# Patient Record
Sex: Male | Born: 1965 | Race: Black or African American | Hispanic: No | Marital: Married | Smoking: Never smoker
Health system: Southern US, Community
[De-identification: ages and names within clinical notes are randomized; demographics above are authoritative.]

## PROBLEM LIST (undated history)

## (undated) DIAGNOSIS — K219 Gastro-esophageal reflux disease without esophagitis: Secondary | ICD-10-CM

## (undated) DIAGNOSIS — E785 Hyperlipidemia, unspecified: Secondary | ICD-10-CM

## (undated) HISTORY — DX: Gastro-esophageal reflux disease without esophagitis: K21.9

## (undated) HISTORY — DX: Hyperlipidemia, unspecified: E78.5

---

## 2012-05-26 ENCOUNTER — Other Ambulatory Visit: Payer: Self-pay

## 2012-05-26 DIAGNOSIS — Z8742 Personal history of other diseases of the female genital tract: Secondary | ICD-10-CM

## 2012-06-09 LAB — CHROMOSOME ANALYSIS, PERIPHERAL BLOOD

## 2012-06-24 ENCOUNTER — Encounter: Payer: Self-pay | Admitting: Obstetrics & Gynecology

## 2013-01-09 DIAGNOSIS — E785 Hyperlipidemia, unspecified: Secondary | ICD-10-CM | POA: Insufficient documentation

## 2016-09-14 DIAGNOSIS — K219 Gastro-esophageal reflux disease without esophagitis: Secondary | ICD-10-CM | POA: Insufficient documentation

## 2017-09-03 ENCOUNTER — Ambulatory Visit: Payer: BC Managed Care – PPO | Admitting: Family Medicine

## 2017-09-03 ENCOUNTER — Encounter: Payer: Self-pay | Admitting: Family Medicine

## 2017-09-03 VITALS — BP 118/78 | HR 81 | Ht 68.5 in | Wt 149.5 lb

## 2017-09-03 DIAGNOSIS — R067 Sneezing: Secondary | ICD-10-CM

## 2017-09-03 DIAGNOSIS — Z7689 Persons encountering health services in other specified circumstances: Secondary | ICD-10-CM

## 2017-09-03 DIAGNOSIS — Z5320 Procedure and treatment not carried out because of patient's decision for unspecified reasons: Secondary | ICD-10-CM

## 2017-09-03 DIAGNOSIS — E785 Hyperlipidemia, unspecified: Secondary | ICD-10-CM

## 2017-09-03 LAB — COMPREHENSIVE METABOLIC PANEL
ALBUMIN: 4.4 g/dL (ref 3.5–5.2)
ALK PHOS: 52 U/L (ref 39–117)
ALT: 20 U/L (ref 0–53)
AST: 19 U/L (ref 0–37)
BILIRUBIN TOTAL: 0.5 mg/dL (ref 0.2–1.2)
BUN: 10 mg/dL (ref 6–23)
CO2: 33 mEq/L — ABNORMAL HIGH (ref 19–32)
Calcium: 10.1 mg/dL (ref 8.4–10.5)
Chloride: 101 mEq/L (ref 96–112)
Creatinine, Ser: 0.81 mg/dL (ref 0.40–1.50)
GFR: 129.1 mL/min (ref >60.00)
GLUCOSE: 70 mg/dL (ref 70–99)
POTASSIUM: 4 meq/L (ref 3.5–5.1)
SODIUM: 139 meq/L (ref 135–145)
TOTAL PROTEIN: 7.4 g/dL (ref 6.0–8.3)

## 2017-09-03 LAB — CBC WITH DIFFERENTIAL/PLATELET
Basophils Absolute: 0 10*3/uL (ref 0.0–0.1)
Basophils Relative: 0.5 % (ref 0.0–3.0)
EOS PCT: 6.3 % — AB (ref 0.0–5.0)
Eosinophils Absolute: 0.5 10*3/uL (ref 0.0–0.7)
HCT: 42.6 % (ref 39.0–52.0)
HEMOGLOBIN: 14.5 g/dL (ref 13.0–17.0)
LYMPHS ABS: 1.8 10*3/uL (ref 0.7–4.0)
Lymphocytes Relative: 21.9 % (ref 12.0–46.0)
MCHC: 34.2 g/dL (ref 30.0–36.0)
MCV: 93.7 fl (ref 78.0–100.0)
MONO ABS: 0.6 10*3/uL (ref 0.1–1.0)
MONOS PCT: 6.9 % (ref 3.0–12.0)
NEUTROS PCT: 64.4 % (ref 43.0–77.0)
Neutro Abs: 5.2 10*3/uL (ref 1.4–7.7)
Platelets: 157 10*3/uL (ref 150.0–400.0)
RBC: 4.55 Mil/uL (ref 4.22–5.81)
RDW: 13.1 % (ref 11.5–15.5)
WBC: 8 10*3/uL (ref 4.0–10.5)

## 2017-09-03 LAB — LIPID PANEL
CHOL/HDL RATIO: 5
Cholesterol: 192 mg/dL (ref 0–200)
HDL: 42.2 mg/dL (ref >39.00)
LDL Cholesterol: 125 mg/dL — ABNORMAL HIGH (ref 0–99)
NONHDL: 149.6
Triglycerides: 125 mg/dL (ref 0.0–149.0)
VLDL: 25 mg/dL (ref 0.0–40.0)

## 2017-09-03 NOTE — Patient Instructions (Addendum)
It was a pleasure to meet you today! I look forward to partnering with you for your health care needs  Please let me know if you would like me to put in a referral for colon cancer screening (information below)  Please follow up next year for complete physical exam  Colorectal Cancer Screening Colorectal cancer screening is a group of tests used to check for colorectal cancer. Colorectal refers to your colon and rectum. Your colon and rectum are located at the end of your large intestine and carry your bowel movements out of your body. Why is colorectal cancer screening done? It is common for abnormal growths (polyps) to form in the lining of your colon, especially as you get older. These polyps can be cancerous or become cancerous. If colorectal cancer is found at an early stage, it is treatable. Who should be screened for colorectal cancer? Screening is recommended for all adults at average risk starting at age 51. Tests may be recommended every 1 to 10 years. Your health care provider may recommend earlier or more frequent screening if you have:  A history of colorectal cancer or polyps.  A family member with a history of colorectal cancer or polyps.  Inflammatory bowel disease, such as ulcerative colitis or Crohn disease.  A type of hereditary colon cancer syndrome.  Colorectal cancer symptoms.  Types of screening tests There are several types of colorectal screening tests. They include:  Guaiac-based fecal occult blood testing.  Fecal immunochemical test (FIT).  Stool DNA test.  Barium enema.  Virtual colonoscopy.  Sigmoidoscopy. During this test, a sigmoidoscope is used to examine your rectum and lower colon. A sigmoidoscope is a flexible tube with a camera that is inserted through your anus into your rectum and lower colon.  Colonoscopy. During this test, a colonoscope is used to examine your entire colon. A colonoscope is a long, thin, flexible tube with a camera. This  test examines your entire colon and rectum.  This information is not intended to replace advice given to you by your health care provider. Make sure you discuss any questions you have with your health care provider. Document Released: 06/11/2009 Document Revised: 08/01/2015 Document Reviewed: 03/30/2013 Elsevier Interactive Patient Education  2018 [employer]   Allergic Rhinitis, Adult Allergic rhinitis is an allergic reaction that affects the mucous membrane inside the nose. It causes sneezing, a runny or stuffy nose, and the feeling of mucus going down the back of the throat (postnasal drip). Allergic rhinitis can be mild to severe. There are two types of allergic rhinitis:  Seasonal. This type is also called hay fever. It happens only during certain seasons.  Perennial. This type can happen at any time of the year.  What are the causes? This condition happens when the body's defense system (immune system) responds to certain harmless substances called allergens as though they were germs.  Seasonal allergic rhinitis is triggered by pollen, which can come from grasses, trees, and weeds. Perennial allergic rhinitis may be caused by:  House dust mites.  Pet dander.  Mold spores.  What are the signs or symptoms? Symptoms of this condition include:  Sneezing.  Runny or stuffy nose (nasal congestion).  Postnasal drip.  Itchy nose.  Tearing of the eyes.  Trouble sleeping.  Daytime sleepiness.  How is this diagnosed? This condition may be diagnosed based on:  Your medical history.  A physical exam.  Tests to check for related conditions, such as: ? Asthma. ? Pink eye. ? Ear  infection. ? Upper respiratory infection.  Tests to find out which allergens trigger your symptoms. These may include skin or blood tests.  How is this treated? There is no cure for this condition, but treatment can help control symptoms. Treatment may include:  Taking medicines that block  allergy symptoms, such as antihistamines. Medicine may be given as a shot, nasal spray, or pill.  Avoiding the allergen.  Desensitization. This treatment involves getting ongoing shots until your body becomes less sensitive to the allergen. This treatment may be done if other treatments do not help.  If taking medicine and avoiding the allergen does not work, new, stronger medicines may be prescribed.  Follow these instructions at home:  Find out what you are allergic to. Common allergens include smoke, dust, and pollen.  Avoid the things you are allergic to. These are some things you can do to help avoid allergens: ? Replace carpet with wood, tile, or vinyl flooring. Carpet can trap dander and dust. ? Do not smoke. Do not allow smoking in your home. ? Change your heating and air conditioning filter at least once a month. ? During allergy season:  Keep windows closed as much as possible.  Plan outdoor activities when pollen counts are lowest. This is usually during the evening hours.  When coming indoors, change clothing and shower before sitting on furniture or bedding.  Take over-the-counter and prescription medicines only as told by your health care provider.  Keep all follow-up visits as told by your health care provider. This is important. Contact a health care provider if:  You have a fever.  You develop a persistent cough.  You make whistling sounds when you breathe (you wheeze).  Your symptoms interfere with your normal daily activities. Get help right away if:  You have shortness of breath. Summary  This condition can be managed by taking medicines as directed and avoiding allergens.  Contact your health care provider if you develop a persistent cough or fever.  During allergy season, keep windows closed as much as possible. This information is not intended to replace advice given to you by your health care provider. Make sure you discuss any questions you have  with your health care provider. Document Released: 09/16/2000 Document Revised: 01/30/2016 Document Reviewed: 01/30/2016 Elsevier Interactive Patient Education  [employer].

## 2017-09-03 NOTE — Progress Notes (Signed)
   Subjective:    Patient ID: Sergio Pace, male    DOB: [birth year], 52 y.o.   MRN: [id number]  HPI This is a 52 yo male who presents today to establish care. Lives with his wife and son, has an older daughter in college. [place] Has sedentary job. Enjoys spending time with his family.    Last CPE- about a year ago PSA- 01/27/16 1.5 Colonoscopy- never Tdap- 11/05/2009 Flu- never Dental- not regular Exercise- not regular  Sneezing with temperature change and some smells. Occasional cough. Does not bother him enough to take medication, just wanted to make sure it was not a disease. Has negative work up for cough last year with negative CXR.   Has had some elevated cholesterol levels in the past.   Father died at 5592, mother died in 5350s  (unknown cause).   He denies headache, chest pain, SOB, abdominal pain, diarrhea/constipation, dysuria/frequency, joint or muscle pain. Weight unchanged for many years.   History reviewed. No pertinent past medical history. History reviewed. No pertinent surgical history. Family History  Problem Relation Age of Onset  . Hypertension Father    Social History   Tobacco Use  . Smoking status: Never Smoker  . Smokeless tobacco: Never Used  Substance Use Topics  . Alcohol use: Yes    Comment: occ  . Drug use: Never      Review of Systems Per HPI    Objective:   Physical Exam Physical Exam  Constitutional: Oriented to person, place, and time. He appears well-developed and well-nourished.  HENT:  Head: Normocephalic and atraumatic.  Eyes: Conjunctivae are normal.  Neck: Normal range of motion. Neck supple.  Cardiovascular: Normal rate, regular rhythm and normal heart sounds.   Pulmonary/Chest: Effort normal and breath sounds normal.  Musculoskeletal: Normal range of motion.  Neurological: Alert and oriented to person, place, and time.  Skin: Skin is warm and dry.  Psychiatric: Normal mood and affect. Behavior is normal.  Judgment and thought content normal.  Vitals reviewed.     BP 118/78 (BP Location: Left Arm, Patient Position: Sitting, Cuff Size: Normal)   Pulse 81   Ht 5' 8.5" (1.74 m)   Wt 149 lb 8 oz (67.8 kg)   SpO2 98%   BMI 22.40 kg/m  [facility] 09/03/2017  Decreased Interest 0  Down, Depressed, Hopeless 0  PHQ - 2 Score 0       Assessment & Plan:  1. Encounter to establish care - records reviewed in Care Everywhere - Follow up for CPE  2. Hyperlipidemia, unspecified hyperlipidemia type - Lipid Panel - CBC with Differential - Comprehensive metabolic panel  3. Sneezing - intermittent, provided information about allergic rhinitis  4. Colon cancer screening declined - discussed screening and provided written information about screening, advised him that I can put in referral to GI at any time if he changes his mind.   Olean Reeeborah Hellen Shanley, FNP-BC  [facility]  09/03/2017 10:33 AM

## 2018-12-05 ENCOUNTER — Encounter: Payer: Self-pay | Admitting: Family Medicine

## 2018-12-05 ENCOUNTER — Other Ambulatory Visit: Payer: Self-pay

## 2018-12-05 ENCOUNTER — Ambulatory Visit (INDEPENDENT_AMBULATORY_CARE_PROVIDER_SITE_OTHER): Payer: BC Managed Care – PPO | Admitting: Family Medicine

## 2018-12-05 VITALS — BP 140/84 | HR 62 | Temp 98.3°F | Wt 151.0 lb

## 2018-12-05 DIAGNOSIS — Z23 Encounter for immunization: Secondary | ICD-10-CM

## 2018-12-05 DIAGNOSIS — Z1211 Encounter for screening for malignant neoplasm of colon: Secondary | ICD-10-CM

## 2018-12-05 DIAGNOSIS — Z0000 Encounter for general adult medical examination without abnormal findings: Secondary | ICD-10-CM

## 2018-12-05 DIAGNOSIS — Z125 Encounter for screening for malignant neoplasm of prostate: Secondary | ICD-10-CM

## 2018-12-05 DIAGNOSIS — E785 Hyperlipidemia, unspecified: Secondary | ICD-10-CM | POA: Diagnosis not present

## 2018-12-05 NOTE — Patient Instructions (Signed)
Good to see you today  Please call your insurance company and ask if Cologuard is covered for colon cancer screening. If it is, please let me know and I will order it for you.    Health Maintenance, Male Adopting a healthy lifestyle and getting preventive care are important in promoting health and wellness. Ask your health care provider about:  The right schedule for you to have regular tests and exams.  Things you can do on your own to prevent diseases and keep yourself healthy. What should I know about diet, weight, and exercise? Eat a healthy diet   Eat a diet that includes plenty of vegetables, fruits, low-fat dairy products, and lean protein.  Do not eat a lot of foods that are high in solid fats, added sugars, or sodium. Maintain a healthy weight Body mass index (BMI) is a measurement that can be used to identify possible weight problems. It estimates body fat based on height and weight. Your health care provider can help determine your BMI and help you achieve or maintain a healthy weight. Get regular exercise Get regular exercise. This is one of the most important things you can do for your health. Most adults should:  Exercise for at least 150 minutes each week. The exercise should increase your heart rate and make you sweat (moderate-intensity exercise).  Do strengthening exercises at least twice a week. This is in addition to the moderate-intensity exercise.  Spend less time sitting. Even light physical activity can be beneficial. Watch cholesterol and blood lipids Have your blood tested for lipids and cholesterol at 51 years of age, then have this test every 5 years. You may need to have your cholesterol levels checked more often if:  Your lipid or cholesterol levels are high.  You are older than 52 years of age.  You are at high risk for heart disease. What should I know about cancer screening? Many types of cancers can be detected early and may often be prevented.  Depending on your health history and family history, you may need to have cancer screening at various ages. This may include screening for:  Colorectal cancer.  Prostate cancer.  Skin cancer.  Lung cancer. What should I know about heart disease, diabetes, and high blood pressure? Blood pressure and heart disease  High blood pressure causes heart disease and increases the risk of stroke. This is more likely to develop in people who have high blood pressure readings, are of African descent, or are overweight.  Talk with your health care provider about your target blood pressure readings.  Have your blood pressure checked: ? Every 3-5 years if you are 46-67 years of age. ? Every year if you are 30 years old or older.  If you are between the ages of 60 and 3 and are a current or former smoker, ask your health care provider if you should have a one-time screening for abdominal aortic aneurysm (AAA). Diabetes Have regular diabetes screenings. This checks your fasting blood sugar level. Have the screening done:  Once every three years after age 10 if you are at a normal weight and have a low risk for diabetes.  More often and at a younger age if you are overweight or have a high risk for diabetes. What should I know about preventing infection? Hepatitis B If you have a higher risk for hepatitis B, you should be screened for this virus. Talk with your health care provider to find out if you are at risk  for hepatitis B infection. Hepatitis C Blood testing is recommended for:  Everyone born from [birth year] through 1964.  Anyone with known risk factors for hepatitis C. Sexually transmitted infections (STIs)  You should be screened each year for STIs, including gonorrhea and chlamydia, if: ? You are sexually active and are younger than 52 years of age. ? You are older than 52 years of age and your health care provider tells you that you are at risk for this type of infection. ? Your sexual  activity has changed since you were last screened, and you are at increased risk for chlamydia or gonorrhea. Ask your health care provider if you are at risk.  Ask your health care provider about whether you are at high risk for HIV. Your health care provider may recommend a prescription medicine to help prevent HIV infection. If you choose to take medicine to prevent HIV, you should first get tested for HIV. You should then be tested every 3 months for as long as you are taking the medicine. Follow these instructions at home: Lifestyle  Do not use any products that contain nicotine or tobacco, such as cigarettes, e-cigarettes, and chewing tobacco. If you need help quitting, ask your health care provider.  Do not use street drugs.  Do not share needles.  Ask your health care provider for help if you need support or information about quitting drugs. Alcohol use  Do not drink alcohol if your health care provider tells you not to drink.  If you drink alcohol: ? Limit how much you have to 0-2 drinks a day. ? Be aware of how much alcohol is in your drink. In the [place] one drink equals one 12 oz bottle of beer (355 mL), one 5 oz glass of wine (148 mL), or one 1 oz glass of hard liquor (44 mL). General instructions  Schedule regular health, dental, and eye exams.  Stay current with your vaccines.  Tell your health care provider if: ? You often feel depressed. ? You have ever been abused or do not feel safe at home. Summary  Adopting a healthy lifestyle and getting preventive care are important in promoting health and wellness.  Follow your health care provider's instructions about healthy diet, exercising, and getting tested or screened for diseases.  Follow your health care provider's instructions on monitoring your cholesterol and blood pressure. This information is not intended to replace advice given to you by your health care provider. Make sure you discuss any questions you have  with your health care provider. Document Released: 06/20/2007 Document Revised: 12/15/2017 Document Reviewed: 12/15/2017 Elsevier Patient Education  2020 [employer].

## 2018-12-05 NOTE — Progress Notes (Signed)
Subjective:    Patient ID: Sergio Pace, male    DOB: June 26, 1965, 53 y.o.   MRN: [id number]  HPI This is a 53 yo male who presents today for CPE.  He has been working from home during the pandemic which has saved him a long commute.  His son is attending college virtually and living at home and his daughter continues to go to school at [employer]  Last CPE- 8/19 PSA- 2018,  Will have today Colonoscopy- has declined in past, will inquire about insurance coverage for Cologuard.  Tdap- 11/05/2009 Flu- will have today Dental- over due, over a year Eye- 2 years ago.  Exercise- occasionally walks, treadmill  Past Medical History:  Diagnosis Date  . GERD (gastroesophageal reflux disease)   . Hyperlipidemia    No past surgical history on file. Family History  Problem Relation Age of Onset  . Hypertension Father      Review of Systems  Constitutional: Negative.   HENT: Negative.   Eyes: Negative.   Respiratory: Negative.   Cardiovascular: Negative.   Endocrine: Negative.   Genitourinary: Negative.   Musculoskeletal: Negative.   Skin: Negative.   Allergic/Immunologic: Negative.   Neurological: Negative.   Hematological: Negative.   Psychiatric/Behavioral: Negative.        Objective:   Physical Exam Vitals signs reviewed.  Constitutional:      General: He is not in acute distress.    Appearance: Normal appearance. He is normal weight. He is not ill-appearing, toxic-appearing or diaphoretic.  HENT:     Head: Normocephalic and atraumatic.     Right Ear: External ear normal.     Left Ear: External ear normal.  Eyes:     Conjunctiva/sclera: Conjunctivae normal.  Neck:     Musculoskeletal: Normal range of motion and neck supple.  Cardiovascular:     Rate and Rhythm: Normal rate and regular rhythm.     Pulses: Normal pulses.     Heart sounds: Normal heart sounds.  Pulmonary:     Effort: Pulmonary effort is normal.     Breath sounds: Normal breath sounds.   Abdominal:     General: Abdomen is flat. Bowel sounds are normal. There is no distension.     Palpations: Abdomen is soft. There is no mass.     Tenderness: There is no abdominal tenderness. There is no guarding or rebound.     Hernia: No hernia is present.  Musculoskeletal: Normal range of motion.     Right lower leg: No edema.     Left lower leg: No edema.  Skin:    General: Skin is warm and dry.  Neurological:     Mental Status: He is alert and oriented to person, place, and time.  Psychiatric:        Mood and Affect: Mood normal.        Behavior: Behavior normal.        Thought Content: Thought content normal.        Judgment: Judgment normal.       BP 140/84 (BP Location: Left Arm, Patient Position: Sitting, Cuff Size: Normal)   Pulse 62   Temp 98.3 F (36.8 C) (Temporal)   Wt 151 lb (68.5 kg)   BMI 22.63 kg/m  Wt Readings from Last 3 Encounters:  12/05/18 151 lb (68.5 kg)  09/03/17 149 lb 8 oz (67.8 kg)       Assessment & Plan:  1. Annual physical exam - Discussed and encouraged healthy lifestyle  choices- adequate sleep, regular exercise, stress management and healthy food choices.    2. Screening for prostate cancer - PSA  3. Hyperlipidemia, unspecified hyperlipidemia type - Lipid Panel  4. Influenza vaccine needed - Flu Vaccine QUAD 6+ mos PF IM (Fluarix Quad PF)  5. Screening for colon cancer - patient will check his insurance coverage for Cologuard and let me know if covered  This visit occurred during the SARS-CoV-2 public health emergency.  Safety protocols were in place, including screening questions prior to the visit, additional usage of staff PPE, and extensive cleaning of exam room while observing appropriate contact time as indicated for disinfecting solutions.    Clarene Reamer, FNP-BC  [facility], [facility]  12/05/2018 2:57 PM

## 2018-12-06 LAB — LIPID PANEL
Cholesterol: 216 mg/dL — ABNORMAL HIGH (ref 0–200)
HDL: 40.5 mg/dL (ref >39.00)
LDL Cholesterol: 142 mg/dL — ABNORMAL HIGH (ref 0–99)
NonHDL: 175.27
Total CHOL/HDL Ratio: 5
Triglycerides: 166 mg/dL — ABNORMAL HIGH (ref 0.0–149.0)
VLDL: 33.2 mg/dL (ref 0.0–40.0)

## 2018-12-06 LAB — PSA: PSA: 1.59 ng/mL (ref 0.10–4.00)

## 2018-12-07 ENCOUNTER — Encounter: Payer: Self-pay | Admitting: Family Medicine

## 2018-12-09 ENCOUNTER — Other Ambulatory Visit: Payer: Self-pay | Admitting: Family Medicine

## 2018-12-09 DIAGNOSIS — Z1211 Encounter for screening for malignant neoplasm of colon: Secondary | ICD-10-CM

## 2019-01-03 LAB — COLOGUARD: Cologuard: NEGATIVE

## 2019-02-07 ENCOUNTER — Encounter: Payer: Self-pay | Admitting: Family Medicine

## 2019-02-07 NOTE — Progress Notes (Signed)
Pt made aware of results.  Aware that these will be scanned into his chart for his review.  Nothing further needed.

## 2019-06-30 ENCOUNTER — Ambulatory Visit: Payer: BC Managed Care – PPO | Admitting: Family Medicine

## 2019-07-03 ENCOUNTER — Ambulatory Visit (INDEPENDENT_AMBULATORY_CARE_PROVIDER_SITE_OTHER)
Admission: RE | Admit: 2019-07-03 | Discharge: 2019-07-03 | Disposition: A | Discharge status: Home / Self Care | Payer: BC Managed Care – PPO | Source: Ambulatory Visit | Attending: Family Medicine | Admitting: Family Medicine

## 2019-07-03 ENCOUNTER — Ambulatory Visit: Payer: BC Managed Care – PPO | Admitting: Family Medicine

## 2019-07-03 ENCOUNTER — Other Ambulatory Visit: Payer: Self-pay

## 2019-07-03 ENCOUNTER — Encounter: Payer: Self-pay | Admitting: Family Medicine

## 2019-07-03 VITALS — BP 118/66 | HR 74 | Temp 98.2°F | Ht 68.5 in | Wt 147.0 lb

## 2019-07-03 DIAGNOSIS — G8929 Other chronic pain: Secondary | ICD-10-CM

## 2019-07-03 DIAGNOSIS — E785 Hyperlipidemia, unspecified: Secondary | ICD-10-CM | POA: Diagnosis not present

## 2019-07-03 DIAGNOSIS — M79672 Pain in left foot: Secondary | ICD-10-CM | POA: Diagnosis not present

## 2019-07-03 LAB — LIPID PANEL
Cholesterol: 181 mg/dL (ref 0–200)
HDL: 42 mg/dL (ref >39.00)
LDL Cholesterol: 119 mg/dL — ABNORMAL HIGH (ref 0–99)
NonHDL: 139.38
Total CHOL/HDL Ratio: 4
Triglycerides: 104 mg/dL (ref 0.0–149.0)
VLDL: 20.8 mg/dL (ref 0.0–40.0)

## 2019-07-03 NOTE — Patient Instructions (Signed)
Plantar Fasciitis Rehab Ask your health care provider which exercises are safe for you. Do exercises exactly as told by your health care provider and adjust them as directed. It is normal to feel mild stretching, pulling, tightness, or discomfort as you do these exercises. Stop right away if you feel sudden pain or your pain gets worse. Do not begin these exercises until told by your health care provider. Stretching and range-of-motion exercises These exercises warm up your muscles and joints and improve the movement and flexibility of your foot. These exercises also help to relieve pain. Plantar fascia stretch  1. Sit with your left / right leg crossed over your opposite knee. 2. Hold your heel with one hand with that thumb near your arch. With your other hand, hold your toes and gently pull them back toward the top of your foot. You should feel a stretch on the bottom of your toes or your foot (plantar fascia) or both. 3. Hold this stretch for__________ seconds. 4. Slowly release your toes and return to the starting position. Repeat __________ times. Complete this exercise __________ times a day. Gastrocnemius stretch, standing This exercise is also called a calf (gastroc) stretch. It stretches the muscles in the back of the upper calf. 1. Stand with your hands against a wall. 2. Extend your left / right leg behind you, and bend your front knee slightly. 3. Keeping your heels on the floor and your back knee straight, shift your weight toward the wall. Do not arch your back. You should feel a gentle stretch in your upper left / right calf. 4. Hold this position for __________ seconds. Repeat __________ times. Complete this exercise __________ times a day. Soleus stretch, standing This exercise is also called a calf (soleus) stretch. It stretches the muscles in the back of the lower calf. 1. Stand with your hands against a wall. 2. Extend your left / right leg behind you, and bend your front  knee slightly. 3. Keeping your heels on the floor, bend your back knee and shift your weight slightly over your back leg. You should feel a gentle stretch deep in your lower calf. 4. Hold this position for __________ seconds. Repeat __________ times. Complete this exercise __________ times a day. Gastroc and soleus stretch, standing step This exercise stretches the muscles in the back of the lower leg. These muscles are in the upper calf (gastrocnemius) and the lower calf (soleus). 1. Stand with the ball of your left / right foot on a step. The ball of your foot is on the walking surface, right under your toes. 2. Keep your other foot firmly on the same step. 3. Hold on to the wall or a railing for balance. 4. Slowly lift your other foot, allowing your body weight to press your left / right heel down over the edge of the step. You should feel a stretch in your left / right calf. 5. Hold this position for __________ seconds. 6. Return both feet to the step. 7. Repeat this exercise with a slight bend in your left / right knee. Repeat __________ times with your left / right knee straight and __________ times with your left / right knee bent. Complete this exercise __________ times a day. Balance exercise This exercise builds your balance and strength control of your arch to help take pressure off your plantar fascia. Single leg stand If this exercise is too easy, you can try it with your eyes closed or while standing on a pillow. 1.   Without shoes, stand near a railing or in a doorway. You may hold on to the railing or door frame as needed. 2. Stand on your left / right foot. Keep your big toe down on the floor and try to keep your arch lifted. Do not let your foot roll inward. 3. Hold this position for __________ seconds. Repeat __________ times. Complete this exercise __________ times a day. This information is not intended to replace advice given to you by your health care provider. Make sure  you discuss any questions you have with your health care provider. Document Revised: 04/14/2018 Document Reviewed: 10/20/2017 Elsevier Patient Education  2020 [employer]  

## 2019-07-03 NOTE — Progress Notes (Signed)
° °  Subjective:    Patient ID: Sergio Pace, male    DOB: 1965-12-28, 54 y.o.   MRN: [id number]  HPI Chief Complaint  Patient presents with   Foot Problem    x 1 year - worsening. Pain with weight bearing when bare foot - left foot only. No noted swelling.    This is a 54 yo male who presents today with above cc.  He has noticed left heel pain when walking barefoot on hard surfaces only.  The pain does not radiate.  He has not noticed any swelling.  Seems to be worse in the morning.  He has not applied heat or ice or taken any medications for his symptoms. History of hyperlipidemia, he would like his lipids checked today.  He has tried to incorporate more whole grains into his diet, especially at breakfast. He has been looking for a new job and has been reviewed near [facility].  Review of Systems Per HPI    Objective:   Physical Exam Vitals reviewed.  Constitutional:      General: He is not in acute distress.    Appearance: Normal appearance. He is normal weight. He is not ill-appearing, toxic-appearing or diaphoretic.  HENT:     Head: Normocephalic and atraumatic.  Eyes:     Conjunctiva/sclera: Conjunctivae normal.  Cardiovascular:     Rate and Rhythm: Normal rate.  Pulmonary:     Effort: Pulmonary effort is normal.  Musculoskeletal:     Left foot: Normal range of motion and normal capillary refill. Tenderness (isolated area of heel) present. No swelling, deformity, bunion or prominent metatarsal heads. Normal pulse.  Skin:    General: Skin is warm and dry.  Neurological:     Mental Status: He is alert and oriented to person, place, and time.  Psychiatric:        Mood and Affect: Mood normal.        Behavior: Behavior normal.        Thought Content: Thought content normal.        Judgment: Judgment normal.      BP 118/66 (BP Location: Right Arm, Patient Position: Sitting, Cuff Size: Normal)    Pulse 74    Temp 98.2 F (36.8 C) (Temporal)    Ht 5' 8.5" (1.74  m)    Wt 147 lb (66.7 kg)    SpO2 100%    BMI 22.03 kg/m  Wt Readings from Last 3 Encounters:  07/03/19 147 lb (66.7 kg)  12/05/18 151 lb (68.5 kg)  09/03/17 149 lb 8 oz (67.8 kg)        Assessment & Plan:  1. Chronic heel pain, left - will get xray to check for bony abnormality. Also discussed possibility of plantar fascitis. Proved stretching exercises. If abnormal xray or no improvement with conservative treatment, will send to podiatry.  - DG Foot Complete Left; Future  2. Hyperlipidemia, unspecified hyperlipidemia type - Lipid Panel  This visit occurred during the SARS-CoV-2 public health emergency.  Safety protocols were in place, including screening questions prior to the visit, additional usage of staff PPE, and extensive cleaning of exam room while observing appropriate contact time as indicated for disinfecting solutions.    Olean Ree, FNP-BC  [facility], [facility]  07/03/2019 8:21 AM

## 2020-01-12 ENCOUNTER — Telehealth: Payer: Self-pay

## 2020-01-12 NOTE — Telephone Encounter (Signed)
Received call from patient on voice mail. Would like call back to set up a TOC with a provider. Was seen by Eunice Blase.

## 2020-01-15 NOTE — Telephone Encounter (Signed)
Spoke with Pt scheduled TOC appointment

## 2020-01-31 ENCOUNTER — Encounter: Payer: BC Managed Care – PPO | Admitting: Family Medicine

## 2020-02-07 ENCOUNTER — Other Ambulatory Visit: Payer: Self-pay

## 2020-02-08 ENCOUNTER — Ambulatory Visit (INDEPENDENT_AMBULATORY_CARE_PROVIDER_SITE_OTHER): Payer: 59 | Admitting: Family Medicine

## 2020-02-08 ENCOUNTER — Encounter: Payer: Self-pay | Admitting: Family Medicine

## 2020-02-08 VITALS — BP 110/62 | HR 88 | Temp 97.8°F | Ht 68.5 in | Wt 149.5 lb

## 2020-02-08 DIAGNOSIS — R002 Palpitations: Secondary | ICD-10-CM | POA: Diagnosis not present

## 2020-02-08 DIAGNOSIS — Z125 Encounter for screening for malignant neoplasm of prostate: Secondary | ICD-10-CM

## 2020-02-08 DIAGNOSIS — E785 Hyperlipidemia, unspecified: Secondary | ICD-10-CM

## 2020-02-08 NOTE — Patient Instructions (Signed)
Return for fasting labs when you can

## 2020-02-08 NOTE — Assessment & Plan Note (Addendum)
Diet controlled in past.. due for re-eval.  Encouraged exercise, weight loss, healthy eating habits.

## 2020-02-08 NOTE — Assessment & Plan Note (Addendum)
New, Recommended eval with EKG.. patient refused today.    Of note it occurs when stressed but no issues with exercise. No associated symptoms.

## 2020-02-08 NOTE — Progress Notes (Signed)
Patient ID: Sergio Pace, male    DOB: 16-Jun-1965, 55 y.o.   MRN: [id number]  This visit was conducted in person.  BP 110/62   Pulse 88   Temp 97.8 F (36.6 C) (Temporal)   Ht 5' 8.5" (1.74 m)   Wt 149 lb 8 oz (67.8 kg)   SpO2 97%   BMI 22.40 kg/m    CC:  Chief Complaint  Patient presents with  . Establish Care    TOC from D. Gessner    Subjective:   HPI: Sergio Pace is a 55 y.o. male presenting on 02/08/2020 for Establish Care (TOC from D. Gessner)  LAST CPX 2020  Elevated Cholesterol:  Diet controlled. Lab Results  Component Value Date   CHOL 181 07/03/2019   HDL 42.00 07/03/2019   LDLCALC 119 (H) 07/03/2019   TRIG 104.0 07/03/2019   CHOLHDL 4 07/03/2019  Using medications without problems: Muscle aches:  Diet compliance: heart healthy Exercise: walking treadmill, running. Other complaints: The 10-year ASCVD risk score Denman George DC Montez Hageman., et al., 2013) is: 4.8%   Values used to calculate the score:     Age: 47 years     Sex: Male     Is Non-Hispanic African American: Yes     Diabetic: No     Tobacco smoker: No     Systolic Blood Pressure: 110 mmHg     Is BP treated: No     HDL Cholesterol: 42 mg/dL     Total Cholesterol: 181 mg/dL     He notes ocasssional heart shaking, but not pain or pressure of SOB.  Denies palpitations.  Relevant past medical, surgical, family and social history reviewed and updated as indicated. Interim medical history since our last visit reviewed. Allergies and medications reviewed and updated. No outpatient medications prior to visit.   No facility-administered medications prior to visit.     Per HPI unless specifically indicated in ROS section below Review of Systems  Constitutional: Negative for fatigue and fever.  HENT: Negative for ear pain.   Eyes: Negative for pain.  Respiratory: Negative for cough and shortness of breath.   Cardiovascular: Negative for chest pain, palpitations and leg swelling.   Gastrointestinal: Negative for abdominal pain.  Genitourinary: Negative for dysuria.  Musculoskeletal: Negative for arthralgias.  Neurological: Negative for syncope, light-headedness and headaches.  Psychiatric/Behavioral: Negative for dysphoric mood.   Objective:  BP 110/62   Pulse 88   Temp 97.8 F (36.6 C) (Temporal)   Ht 5' 8.5" (1.74 m)   Wt 149 lb 8 oz (67.8 kg)   SpO2 97%   BMI 22.40 kg/m   Wt Readings from Last 3 Encounters:  02/08/20 149 lb 8 oz (67.8 kg)  07/03/19 147 lb (66.7 kg)  12/05/18 151 lb (68.5 kg)      Physical Exam Constitutional:      General: Vital signs are normal.     Appearance: He is well-developed and well-nourished.  HENT:     Head: Normocephalic.     Right Ear: Hearing normal.     Left Ear: Hearing normal.     Nose: Nose normal.     Mouth/Throat:     Mouth: Oropharynx is clear and moist and mucous membranes are normal.  Neck:     Thyroid: No thyroid mass or thyromegaly.     Vascular: No carotid bruit.     Trachea: Trachea normal.  Cardiovascular:     Rate and Rhythm: Normal rate and regular rhythm.  Pulses: Normal pulses.     Heart sounds: Heart sounds not distant. No murmur heard. No friction rub. No gallop.      Comments: No peripheral edema Pulmonary:     Effort: Pulmonary effort is normal. No respiratory distress.     Breath sounds: Normal breath sounds.  Skin:    General: Skin is warm, dry and intact.     Findings: No rash.  Psychiatric:        Mood and Affect: Mood and affect normal.        Speech: Speech normal.        Behavior: Behavior normal.        Thought Content: Thought content normal.       Results for orders placed or performed in visit on 07/03/19  Lipid Panel  Result Value Ref Range   Cholesterol 181 0 - 200 mg/dL   Triglycerides 836.6 0.0 - 149.0 mg/dL   HDL 29.47 >65.46 mg/dL   VLDL 50.3 0.0 - 54.6 mg/dL   LDL Cholesterol 568 (H) 0 - 99 mg/dL   Total CHOL/HDL Ratio 4    NonHDL 139.38     This  visit occurred during the SARS-CoV-2 public health emergency.  Safety protocols were in place, including screening questions prior to the visit, additional usage of staff PPE, and extensive cleaning of exam room while observing appropriate contact time as indicated for disinfecting solutions.   COVID 19 screen:  No recent travel or known exposure to COVID19 The patient denies respiratory symptoms of COVID 19 at this time. The importance of social distancing was discussed today.   Assessment and Plan Problem List Items Addressed This Visit    Hyperlipidemia - Primary (Chronic)     Diet controlled in past.. due for re-eval.  Encouraged exercise, weight loss, healthy eating habits.       Relevant Orders   Lipid panel   Comprehensive metabolic panel   Palpitation    New, Recommended eval with EKG.. patient refused today.    Of note it occurs when stressed but no issues with exercise. No associated symptoms.       Other Visit Diagnoses    Prostate cancer screening       Relevant Orders   PSA         Kerby Nora, MD

## 2020-02-13 ENCOUNTER — Other Ambulatory Visit (INDEPENDENT_AMBULATORY_CARE_PROVIDER_SITE_OTHER): Payer: 59

## 2020-02-13 ENCOUNTER — Other Ambulatory Visit: Payer: Self-pay

## 2020-02-13 DIAGNOSIS — E785 Hyperlipidemia, unspecified: Secondary | ICD-10-CM

## 2020-02-13 DIAGNOSIS — Z125 Encounter for screening for malignant neoplasm of prostate: Secondary | ICD-10-CM | POA: Diagnosis not present

## 2020-02-13 LAB — COMPREHENSIVE METABOLIC PANEL
ALT: 17 U/L (ref 0–53)
AST: 21 U/L (ref 0–37)
Albumin: 4.6 g/dL (ref 3.5–5.2)
Alkaline Phosphatase: 54 U/L (ref 39–117)
BUN: 11 mg/dL (ref 6–23)
CO2: 31 mEq/L (ref 19–32)
Calcium: 10.2 mg/dL (ref 8.4–10.5)
Chloride: 103 mEq/L (ref 96–112)
Creatinine, Ser: 0.77 mg/dL (ref 0.40–1.50)
GFR: 102.12 mL/min (ref >60.00)
Glucose, Bld: 75 mg/dL (ref 70–99)
Potassium: 3.5 mEq/L (ref 3.5–5.1)
Sodium: 139 mEq/L (ref 135–145)
Total Bilirubin: 0.6 mg/dL (ref 0.2–1.2)
Total Protein: 7.6 g/dL (ref 6.0–8.3)

## 2020-02-13 LAB — LIPID PANEL
Cholesterol: 208 mg/dL — ABNORMAL HIGH (ref 0–200)
HDL: 50.2 mg/dL (ref >39.00)
LDL Cholesterol: 136 mg/dL — ABNORMAL HIGH (ref 0–99)
NonHDL: 157.37
Total CHOL/HDL Ratio: 4
Triglycerides: 106 mg/dL (ref 0.0–149.0)
VLDL: 21.2 mg/dL (ref 0.0–40.0)

## 2020-02-13 LAB — PSA: PSA: 2.13 ng/mL (ref 0.10–4.00)

## 2020-05-21 ENCOUNTER — Telehealth: Payer: Self-pay | Admitting: Family Medicine

## 2020-05-21 DIAGNOSIS — Z1159 Encounter for screening for other viral diseases: Secondary | ICD-10-CM

## 2020-05-21 DIAGNOSIS — Z125 Encounter for screening for malignant neoplasm of prostate: Secondary | ICD-10-CM

## 2020-05-21 DIAGNOSIS — E785 Hyperlipidemia, unspecified: Secondary | ICD-10-CM

## 2020-05-21 NOTE — Telephone Encounter (Signed)
-----   Message from Aquilla Solian, RT sent at 05/20/2020  1:45 PM EDT ----- Regarding: Lab Orders for Tuesday 5.31.2022 Please place lab orders for Tuesday 5.31.2022, office visit for physical on Tuesday 6.7.2022 Thank you, Jones Bales RT(R)

## 2020-06-04 ENCOUNTER — Other Ambulatory Visit: Payer: 59

## 2020-06-11 ENCOUNTER — Encounter: Payer: 59 | Admitting: Family Medicine

## 2020-06-14 ENCOUNTER — Encounter: Payer: 59 | Admitting: Family Medicine

## 2020-07-25 ENCOUNTER — Encounter: Payer: 59 | Admitting: Family Medicine

## 2020-08-30 ENCOUNTER — Encounter: Payer: Self-pay | Admitting: Family Medicine

## 2020-08-30 ENCOUNTER — Other Ambulatory Visit: Payer: Self-pay

## 2020-08-30 ENCOUNTER — Ambulatory Visit (INDEPENDENT_AMBULATORY_CARE_PROVIDER_SITE_OTHER): Payer: 59 | Admitting: Family Medicine

## 2020-08-30 VITALS — BP 110/60 | HR 81 | Temp 98.4°F | Ht 68.75 in | Wt 153.0 lb

## 2020-08-30 DIAGNOSIS — R0982 Postnasal drip: Secondary | ICD-10-CM | POA: Diagnosis not present

## 2020-08-30 DIAGNOSIS — J309 Allergic rhinitis, unspecified: Secondary | ICD-10-CM

## 2020-08-30 DIAGNOSIS — Z0000 Encounter for general adult medical examination without abnormal findings: Secondary | ICD-10-CM

## 2020-08-30 DIAGNOSIS — Z23 Encounter for immunization: Secondary | ICD-10-CM | POA: Diagnosis not present

## 2020-08-30 DIAGNOSIS — E785 Hyperlipidemia, unspecified: Secondary | ICD-10-CM

## 2020-08-30 NOTE — Patient Instructions (Addendum)
Consider tetanus and shinrgix in the future.  Start flonase 2 sprays per nostril daily... call if not better in 2-3 weeks.  Can also try Zyrtec at bedtime.

## 2020-08-30 NOTE — Progress Notes (Signed)
Patient ID: Sergio Pace, male    DOB: 1965/05/02, 55 y.o.   MRN: [id number]  This visit was conducted in person.  BP 110/60   Pulse 81   Temp 98.4 F (36.9 C) (Temporal)   Ht 5' 8.75" (1.746 m)   Wt 153 lb (69.4 kg)   SpO2 97%   BMI 22.76 kg/m    CC: Chief Complaint  Patient presents with   Annual Exam    Subjective:   HPI: Sergio Pace is a 55 y.o. male presenting on 08/30/2020 for Annual Exam   He has been sneezing a lot in AMs.Marland Kitchen with temperature changes mainly x 9 months. No sinus pain,  no congestion. Does cough when on phone. No SOB,  no wheeze.  Elevated Cholesterol: Due for lab recheck Lab Results  Component Value Date   CHOL 208 (H) 02/13/2020   HDL 50.20 02/13/2020   LDLCALC 136 (H) 02/13/2020   TRIG 106.0 02/13/2020   CHOLHDL 4 02/13/2020   The 10-year ASCVD risk score Denman George DC Jr., et al., 2013) is: 4.9%   Values used to calculate the score:     Age: 21 years     Sex: Male     Is Non-Hispanic African American: Yes     Diabetic: No     Tobacco smoker: No     Systolic Blood Pressure: 110 mmHg     Is BP treated: No     HDL Cholesterol: 50.2 mg/dL     Total Cholesterol: 208 mg/dL  Using medications without problems: Muscle aches:  Diet compliance:healthy foods Exercise:1-2 day a week, walking/treadmill, weight lifting.. some water. Other complaints:     Relevant past medical, surgical, family and social history reviewed and updated as indicated. Interim medical history since our last visit reviewed. Allergies and medications reviewed and updated. No outpatient medications prior to visit.   No facility-administered medications prior to visit.     Per HPI unless specifically indicated in ROS section below Review of Systems  Constitutional:  Negative for fatigue and fever.  HENT:  Negative for ear pain.   Eyes:  Negative for pain.  Respiratory:  Negative for cough and shortness of breath.   Cardiovascular:  Negative for chest pain,  palpitations and leg swelling.  Gastrointestinal:  Negative for abdominal pain.  Genitourinary:  Negative for dysuria.  Musculoskeletal:  Negative for arthralgias.  Neurological:  Negative for syncope, light-headedness and headaches.  Psychiatric/Behavioral:  Negative for dysphoric mood.   Objective:  BP 110/60   Pulse 81   Temp 98.4 F (36.9 C) (Temporal)   Ht 5' 8.75" (1.746 m)   Wt 153 lb (69.4 kg)   SpO2 97%   BMI 22.76 kg/m   Wt Readings from Last 3 Encounters:  08/30/20 153 lb (69.4 kg)  02/08/20 149 lb 8 oz (67.8 kg)  07/03/19 147 lb (66.7 kg)      Physical Exam Constitutional:      General: He is not in acute distress.    Appearance: Normal appearance. He is well-developed. He is not ill-appearing or toxic-appearing.  HENT:     Head: Normocephalic and atraumatic.     Right Ear: Hearing, tympanic membrane, ear canal and external ear normal.     Left Ear: Hearing, tympanic membrane, ear canal and external ear normal.     Nose: Nose normal. No septal deviation.     Right Turbinates: Enlarged, swollen and pale.     Left Turbinates: Enlarged, swollen and pale.  Mouth/Throat:     Pharynx: Uvula midline.  Eyes:     General: Lids are normal. Lids are everted, no foreign bodies appreciated.     Conjunctiva/sclera: Conjunctivae normal.     Pupils: Pupils are equal, round, and reactive to light.  Neck:     Thyroid: No thyroid mass or thyromegaly.     Vascular: No carotid bruit.     Trachea: Trachea and phonation normal.  Cardiovascular:     Rate and Rhythm: Normal rate and regular rhythm.     Pulses: Normal pulses.     Heart sounds: S1 normal and S2 normal. No murmur heard.   No gallop.  Pulmonary:     Breath sounds: Normal breath sounds. No wheezing, rhonchi or rales.  Abdominal:     General: Bowel sounds are normal.     Palpations: Abdomen is soft.     Tenderness: There is no abdominal tenderness. There is no guarding or rebound.     Hernia: No hernia is  present.  Musculoskeletal:     Cervical back: Normal range of motion and neck supple.  Lymphadenopathy:     Cervical: No cervical adenopathy.  Skin:    General: Skin is warm and dry.     Findings: No rash.  Neurological:     Mental Status: He is alert.     Cranial Nerves: No cranial nerve deficit.     Sensory: No sensory deficit.     Gait: Gait normal.     Deep Tendon Reflexes: Reflexes are normal and symmetric.  Psychiatric:        Speech: Speech normal.        Behavior: Behavior normal.        Judgment: Judgment normal.      Results for orders placed or performed in visit on 02/13/20  PSA  Result Value Ref Range   PSA 2.13 0.10 - 4.00 ng/mL  Comprehensive metabolic panel  Result Value Ref Range   Sodium 139 135 - 145 mEq/L   Potassium 3.5 3.5 - 5.1 mEq/L   Chloride 103 96 - 112 mEq/L   CO2 31 19 - 32 mEq/L   Glucose, Bld 75 70 - 99 mg/dL   BUN 11 6 - 23 mg/dL   Creatinine, Ser 0.35 0.40 - 1.50 mg/dL   Total Bilirubin 0.6 0.2 - 1.2 mg/dL   Alkaline Phosphatase 54 39 - 117 U/L   AST 21 0 - 37 U/L   ALT 17 0 - 53 U/L   Total Protein 7.6 6.0 - 8.3 g/dL   Albumin 4.6 3.5 - 5.2 g/dL   GFR 465.68 >12.75 mL/min   Calcium 10.2 8.4 - 10.5 mg/dL  Lipid panel  Result Value Ref Range   Cholesterol 208 (H) 0 - 200 mg/dL   Triglycerides 170.0 0.0 - 149.0 mg/dL   HDL 17.49 >44.96 mg/dL   VLDL 75.9 0.0 - 16.3 mg/dL   LDL Cholesterol 846 (H) 0 - 99 mg/dL   Total CHOL/HDL Ratio 4    NonHDL 157.37     This visit occurred during the SARS-CoV-2 public health emergency.  Safety protocols were in place, including screening questions prior to the visit, additional usage of staff PPE, and extensive cleaning of exam room while observing appropriate contact time as indicated for disinfecting solutions.   COVID 19 screen:  No recent travel or known exposure to COVID19 The patient denies respiratory symptoms of COVID 19 at this time. The importance of social distancing was discussed  today.  Assessment and Plan The patient's preventative maintenance and recommended screening tests for an annual wellness exam were reviewed in full today. Brought up to date unless services declined.  Counselled on the importance of diet, exercise, and its role in overall health and mortality. The patient's FH and SH was reviewed, including their home life, tobacco status, and drug and alcohol status.    Vaccines:covid X 3, DUE FOR FLU AND TDAP Prostate Cancer Screen:  Lab Results  Component Value Date   PSA 2.13 02/13/2020   PSA 1.59 12/05/2018  Colon Cancer Screen: 2021 cologuard negative, repeat in 2024      Smoking Status:none ETOH/ drug use occ/none  Hep C: DUE  HIV screen:   none  Problem List Items Addressed This Visit     Hyperlipidemia (Chronic)    Diet controlled.      Allergic rhinitis    tart flonase 2 sprays per nostril daily... call if not better in 2-3 weeks.  Can also try Zyrtec at bedtime.       Post-nasal drip   Other Visit Diagnoses     Routine general medical examination at a health care facility    -  Primary         Kerby Nora, MD

## 2020-08-30 NOTE — Assessment & Plan Note (Signed)
tart flonase 2 sprays per nostril daily... call if not better in 2-3 weeks.  Can also try Zyrtec at bedtime.

## 2020-08-30 NOTE — Addendum Note (Signed)
Addended by: Damita Lack on: 08/30/2020 02:55 PM   Modules accepted: Orders

## 2020-08-30 NOTE — Assessment & Plan Note (Signed)
Diet controlled.  

## 2021-01-23 IMAGING — DX DG FOOT COMPLETE 3+V*L*
3 series · 3 of 3 positions shown · non-contrast
Comparison: None.

CLINICAL DATA: Pain posteriorly

EXAM:
LEFT FOOT - COMPLETE 3+ VIEW

[foot ap]
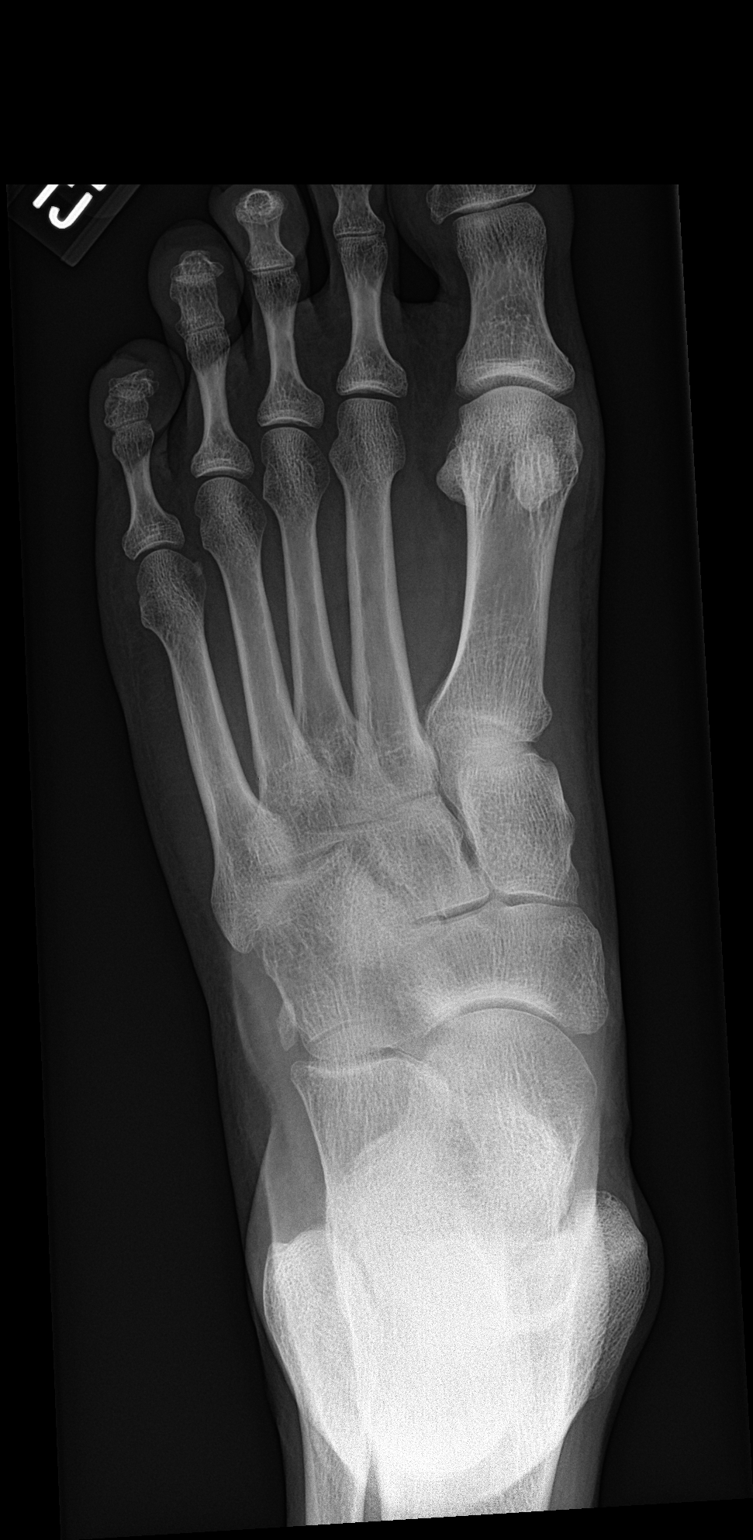

[foot obl]
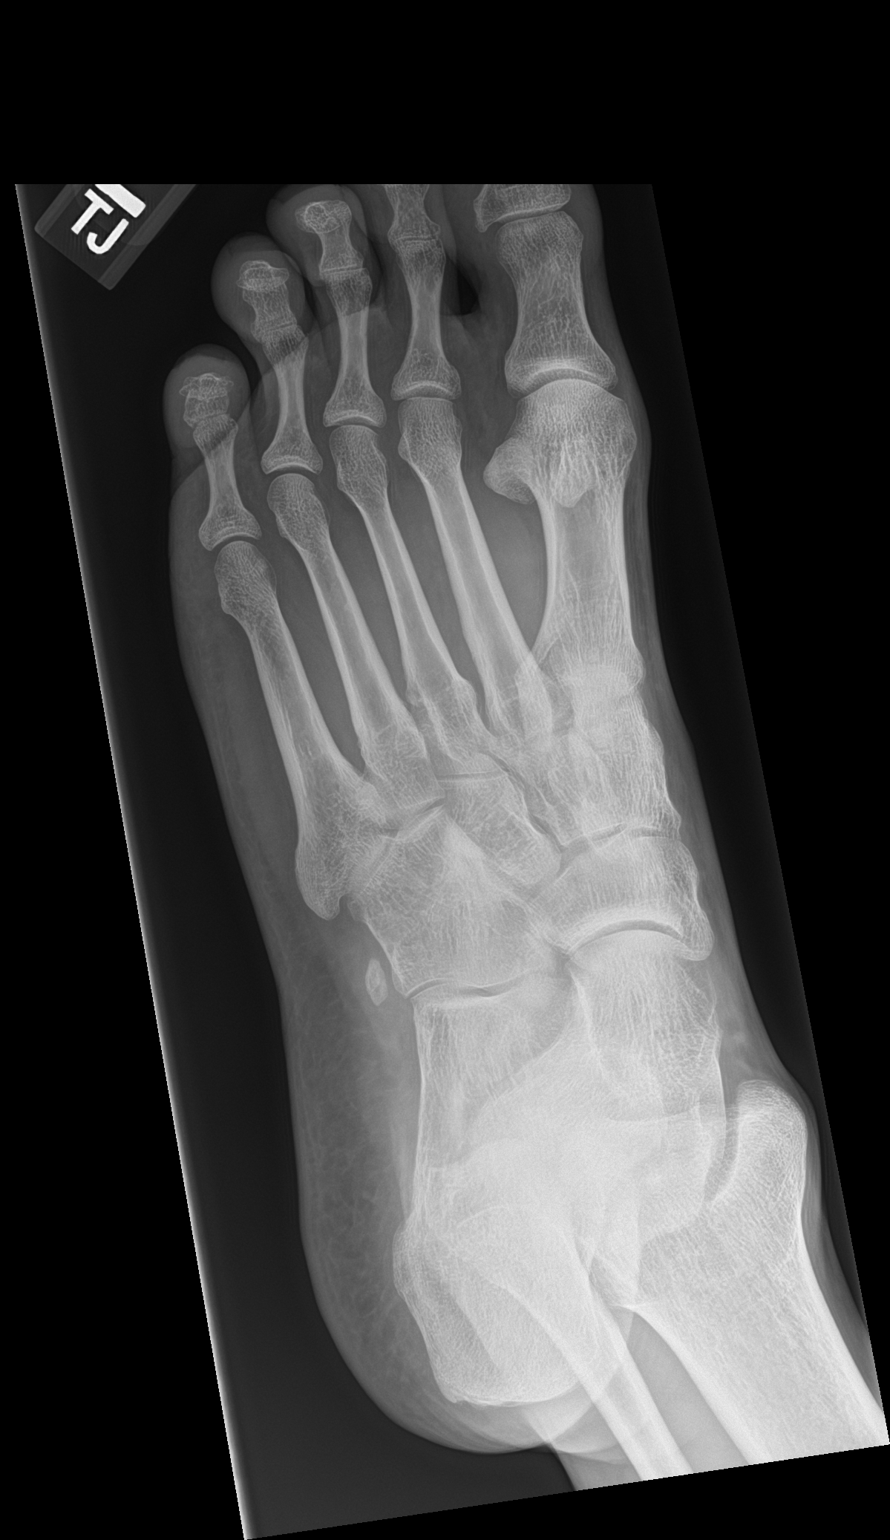

[foot lat]
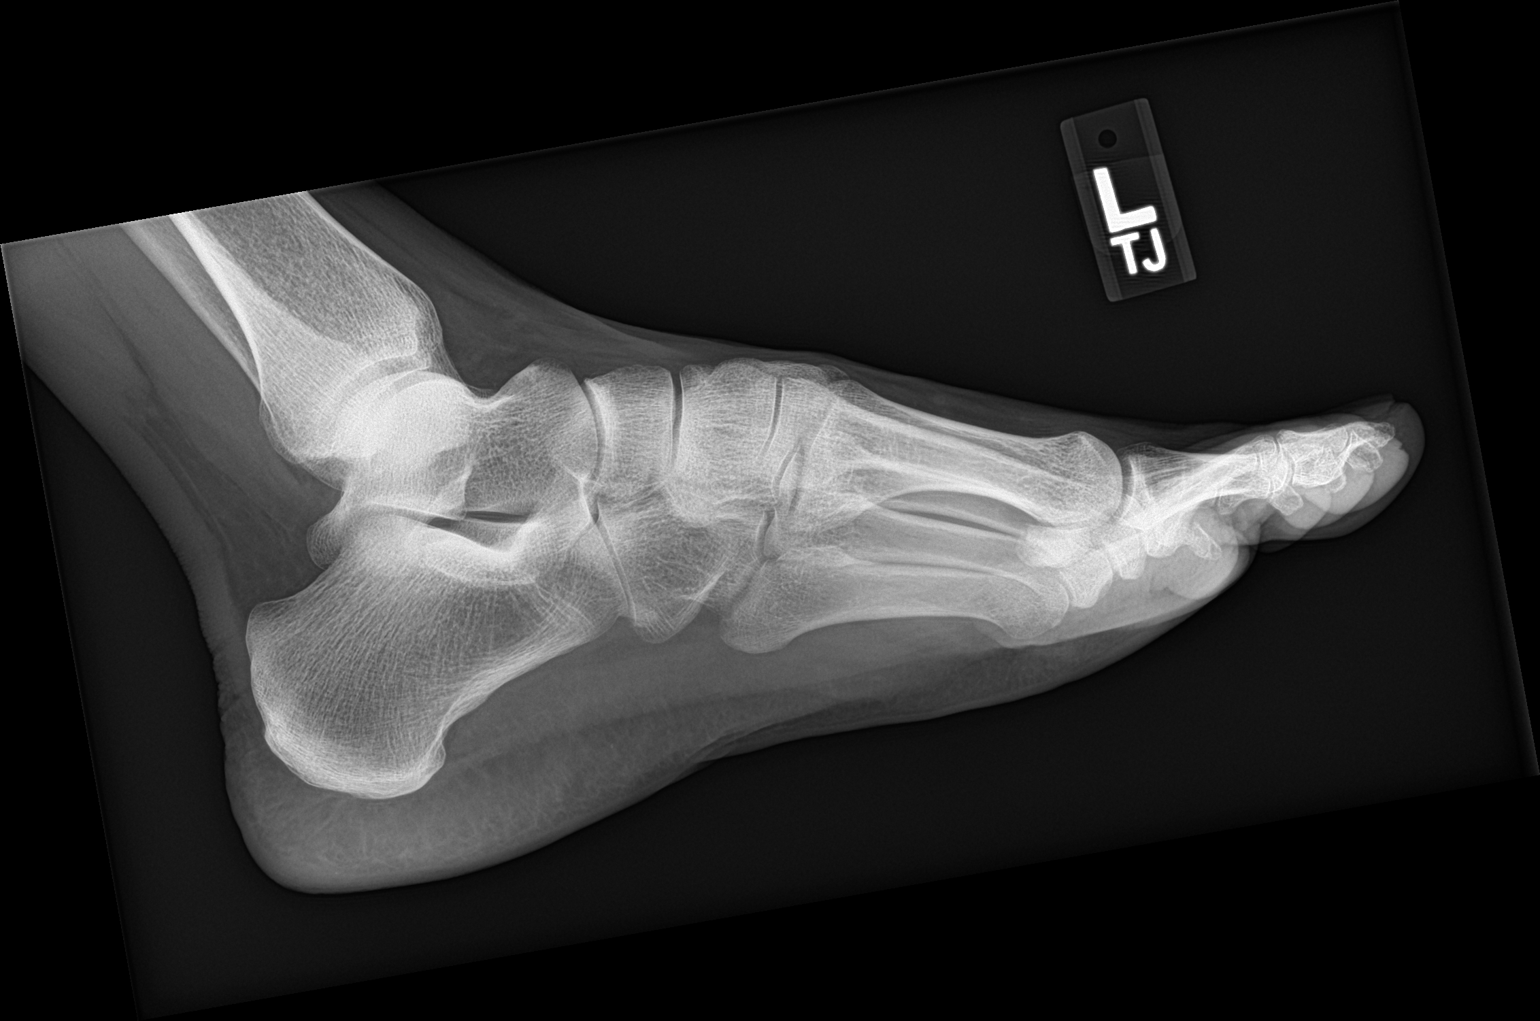

[3 of 3 positions shown; findings below may reference images not displayed]

FINDINGS: Frontal, oblique, lateral views were obtained. No fracture or
dislocation. Joint spaces appear normal. No erosive change. There is
an os peroneum, an anatomic variant. No evident calcaneal spur.
IMPRESSION: No fracture or dislocation.  No appreciable arthropathy.

## 2021-04-28 ENCOUNTER — Telehealth: Payer: Self-pay | Admitting: Family Medicine

## 2021-04-28 NOTE — Telephone Encounter (Signed)
Pt dropped FMLA forms off to be filled out. Pt states forms need to be completed and pick up by 4/28. Placed in providers mailbox.Marland Kitchen  ?

## 2021-04-29 NOTE — Telephone Encounter (Signed)
Please call patient what is the reason for the FMLA.  Please try to collect some information on questions.  If this is new FMLA patient will likely need to be seen to complete the form. ?

## 2021-04-29 NOTE — Telephone Encounter (Signed)
Put FMLA in provider box. ?

## 2021-04-30 NOTE — Telephone Encounter (Signed)
Spoke with Sergio Pace and scheduled an appointment with Dr. Ermalene Searing on 05/02/2021 at 9:00 am to fill out FMLA paperwork. ?FYI to Dr. Ermalene Searing.  ?

## 2021-05-02 ENCOUNTER — Encounter: Payer: Self-pay | Admitting: Family Medicine

## 2021-05-02 ENCOUNTER — Ambulatory Visit (INDEPENDENT_AMBULATORY_CARE_PROVIDER_SITE_OTHER): Payer: 59 | Admitting: Family Medicine

## 2021-05-02 VITALS — BP 120/80 | HR 65 | Temp 98.1°F | Ht 68.75 in | Wt 154.6 lb

## 2021-05-02 DIAGNOSIS — Z029 Encounter for administrative examinations, unspecified: Secondary | ICD-10-CM | POA: Diagnosis not present

## 2021-05-02 NOTE — Progress Notes (Signed)
Patient ID: Sergio Pace, male    DOB: 30-Jun-1965, 56 y.o.   MRN: [id number]  This visit was conducted in person.  BP 120/80   Pulse 65   Temp 98.1 F (36.7 C) (Oral)   Ht 5' 8.75" (1.746 m)   Wt 154 lb 9 oz (70.1 kg)   SpO2 99%   BMI 22.99 kg/m    CC:  Chief Complaint  Patient presents with   FMLA Paperwork    Subjective:   HPI: Sergio Pace is a 56 y.o. male presenting on 05/02/2021 for FMLA Paperwork      He reports that his son has been having issues with bipolar do , schizophrenia.  He has been in and out of psychiatric hospitals.   He works from home.  6-8 weeks ago his son  had an issue with regression of mood and ability to care for him self.  He  had to leave work on 03/14/21.  He will needs to be out  until  July 17/2023  Sergio Pace is primary caregiver. He helps son with safety at home,  getting to appointment,  administering medications, self safety etc.       Relevant past medical, surgical, family and social history reviewed and updated as indicated. Interim medical history since our last visit reviewed. Allergies and medications reviewed and updated. No outpatient medications prior to visit.   No facility-administered medications prior to visit.     Per HPI unless specifically indicated in ROS section below Review of Systems  Constitutional:  Negative for fatigue and fever.  HENT:  Negative for ear pain.   Eyes:  Negative for pain.  Respiratory:  Negative for cough and shortness of breath.   Cardiovascular:  Negative for chest pain, palpitations and leg swelling.  Gastrointestinal:  Negative for abdominal pain.  Genitourinary:  Negative for dysuria.  Musculoskeletal:  Negative for arthralgias.  Neurological:  Negative for syncope, light-headedness and headaches.  Psychiatric/Behavioral:  Negative for dysphoric mood.   Objective:  BP 120/80   Pulse 65   Temp 98.1 F (36.7 C) (Oral)   Ht 5' 8.75" (1.746 m)   Wt 154 lb 9 oz (70.1 kg)    SpO2 99%   BMI 22.99 kg/m   Wt Readings from Last 3 Encounters:  05/02/21 154 lb 9 oz (70.1 kg)  08/30/20 153 lb (69.4 kg)  02/08/20 149 lb 8 oz (67.8 kg)      Physical Exam Constitutional:      Appearance: He is well-developed.  HENT:     Head: Normocephalic.     Right Ear: Hearing normal.     Left Ear: Hearing normal.     Nose: Nose normal.  Neck:     Thyroid: No thyroid mass or thyromegaly.     Vascular: No carotid bruit.     Trachea: Trachea normal.  Cardiovascular:     Rate and Rhythm: Normal rate and regular rhythm.     Pulses: Normal pulses.     Heart sounds: Heart sounds not distant. No murmur heard.   No friction rub. No gallop.     Comments: No peripheral edema Pulmonary:     Effort: Pulmonary effort is normal. No respiratory distress.     Breath sounds: Normal breath sounds.  Skin:    General: Skin is warm and dry.     Findings: No rash.  Psychiatric:        Speech: Speech normal.        Behavior:  Behavior normal.        Thought Content: Thought content normal.      Results for orders placed or performed in visit on 02/13/20  PSA  Result Value Ref Range   PSA 2.13 0.10 - 4.00 ng/mL  Comprehensive metabolic panel  Result Value Ref Range   Sodium 139 135 - 145 mEq/L   Potassium 3.5 3.5 - 5.1 mEq/L   Chloride 103 96 - 112 mEq/L   CO2 31 19 - 32 mEq/L   Glucose, Bld 75 70 - 99 mg/dL   BUN 11 6 - 23 mg/dL   Creatinine, Ser 1.28 0.40 - 1.50 mg/dL   Total Bilirubin 0.6 0.2 - 1.2 mg/dL   Alkaline Phosphatase 54 39 - 117 U/L   AST 21 0 - 37 U/L   ALT 17 0 - 53 U/L   Total Protein 7.6 6.0 - 8.3 g/dL   Albumin 4.6 3.5 - 5.2 g/dL   GFR 786.76 >72.09 mL/min   Calcium 10.2 8.4 - 10.5 mg/dL  Lipid panel  Result Value Ref Range   Cholesterol 208 (H) 0 - 200 mg/dL   Triglycerides 470.9 0.0 - 149.0 mg/dL   HDL 62.83 >66.29 mg/dL   VLDL 47.6 0.0 - 54.6 mg/dL   LDL Cholesterol 503 (H) 0 - 99 mg/dL   Total CHOL/HDL Ratio 4    NonHDL 157.37     This visit  occurred during the SARS-CoV-2 public health emergency.  Safety protocols were in place, including screening questions prior to the visit, additional usage of staff PPE, and extensive cleaning of exam room while observing appropriate contact time as indicated for disinfecting solutions.   COVID 19 screen:  No recent travel or known exposure to COVID19 The patient denies respiratory symptoms of COVID 19 at this time. The importance of social distancing was discussed today.   Assessment and Plan    Problem List Items Addressed This Visit     Administrative encounter - Primary    Discussed medical leave in detail with patient in regards to support for his son.  Son is unable to care for himself with issues with bipolar disorder and schizophrenia.  He has had multiple psychiatric hospitalizations within the last year.  Sergio Pace is the primary caregiver for his son.  He helps his son with safety at home, getting to appointments, administering medications and self safety.  Paperwork for FMLA leave completed.         Kerby Nora, MD

## 2021-06-11 DIAGNOSIS — Z029 Encounter for administrative examinations, unspecified: Secondary | ICD-10-CM | POA: Insufficient documentation

## 2021-06-11 NOTE — Assessment & Plan Note (Signed)
Discussed medical leave in detail with patient in regards to support for his son.  Son is unable to care for himself with issues with bipolar disorder and schizophrenia.  He has had multiple psychiatric hospitalizations within the last year.  Sergio Pace is the primary caregiver for his son.  He helps his son with safety at home, getting to appointments, administering medications and self safety.  Paperwork for FMLA leave completed.

## 2022-03-09 ENCOUNTER — Telehealth: Payer: Self-pay | Admitting: *Deleted

## 2022-03-09 DIAGNOSIS — Z125 Encounter for screening for malignant neoplasm of prostate: Secondary | ICD-10-CM

## 2022-03-09 DIAGNOSIS — Z1159 Encounter for screening for other viral diseases: Secondary | ICD-10-CM

## 2022-03-09 DIAGNOSIS — Z114 Encounter for screening for human immunodeficiency virus [HIV]: Secondary | ICD-10-CM

## 2022-03-09 DIAGNOSIS — E785 Hyperlipidemia, unspecified: Secondary | ICD-10-CM

## 2022-03-09 DIAGNOSIS — Z1211 Encounter for screening for malignant neoplasm of colon: Secondary | ICD-10-CM

## 2022-03-09 NOTE — Telephone Encounter (Signed)
-----   Message from Velna Hatchet, RT sent at 03/09/2022  2:08 PM EST ----- Regarding: Wed 3/20 lab Patient is scheduled for cpx, please order future labs.  Thanks, Anda Kraft

## 2022-03-25 ENCOUNTER — Other Ambulatory Visit: Payer: 59

## 2022-04-01 ENCOUNTER — Encounter: Payer: Self-pay | Admitting: Family Medicine

## 2022-04-01 ENCOUNTER — Ambulatory Visit (INDEPENDENT_AMBULATORY_CARE_PROVIDER_SITE_OTHER): Payer: Federal, State, Local not specified - PPO | Admitting: Family Medicine

## 2022-04-01 DIAGNOSIS — Z114 Encounter for screening for human immunodeficiency virus [HIV]: Secondary | ICD-10-CM

## 2022-04-01 DIAGNOSIS — Z1211 Encounter for screening for malignant neoplasm of colon: Secondary | ICD-10-CM

## 2022-04-01 DIAGNOSIS — E785 Hyperlipidemia, unspecified: Secondary | ICD-10-CM

## 2022-04-01 DIAGNOSIS — Z1159 Encounter for screening for other viral diseases: Secondary | ICD-10-CM | POA: Diagnosis not present

## 2022-04-01 DIAGNOSIS — Z125 Encounter for screening for malignant neoplasm of prostate: Secondary | ICD-10-CM | POA: Diagnosis not present

## 2022-04-01 DIAGNOSIS — Z0000 Encounter for general adult medical examination without abnormal findings: Secondary | ICD-10-CM

## 2022-04-01 LAB — COMPREHENSIVE METABOLIC PANEL
ALT: 15 U/L (ref 0–53)
AST: 18 U/L (ref 0–37)
Albumin: 4.4 g/dL (ref 3.5–5.2)
Alkaline Phosphatase: 54 U/L (ref 39–117)
BUN: 9 mg/dL (ref 6–23)
CO2: 30 mEq/L (ref 19–32)
Calcium: 10.1 mg/dL (ref 8.4–10.5)
Chloride: 100 mEq/L (ref 96–112)
Creatinine, Ser: 0.77 mg/dL (ref 0.40–1.50)
GFR: 100.6 mL/min (ref >60.00)
Glucose, Bld: 80 mg/dL (ref 70–99)
Potassium: 4.1 mEq/L (ref 3.5–5.1)
Sodium: 138 mEq/L (ref 135–145)
Total Bilirubin: 0.4 mg/dL (ref 0.2–1.2)
Total Protein: 7.1 g/dL (ref 6.0–8.3)

## 2022-04-01 LAB — LIPID PANEL
Cholesterol: 206 mg/dL — ABNORMAL HIGH (ref 0–200)
HDL: 50.9 mg/dL (ref >39.00)
LDL Cholesterol: 136 mg/dL — ABNORMAL HIGH (ref 0–99)
NonHDL: 154.9
Total CHOL/HDL Ratio: 4
Triglycerides: 95 mg/dL (ref 0.0–149.0)
VLDL: 19 mg/dL (ref 0.0–40.0)

## 2022-04-01 LAB — PSA: PSA: 2.18 ng/mL (ref 0.10–4.00)

## 2022-04-01 NOTE — Addendum Note (Signed)
Addended by: Ellamae Sia on: 04/01/2022 11:15 AM   Modules accepted: Orders

## 2022-04-01 NOTE — Patient Instructions (Signed)
Please stop at the lab to have labs drawn.  

## 2022-04-01 NOTE — Progress Notes (Signed)
Patient ID: Sergio Pace, male    DOB: 30-Jun-1965, 57 y.o.   MRN: [id number]  This visit was conducted in person.  BP 120/74   Pulse 61   Temp 98.1 F (36.7 C) (Temporal)   Ht 5' 8.75" (1.746 m)   Wt 153 lb 2 oz (69.5 kg)   SpO2 99%   BMI 22.78 kg/m    CC: Chief Complaint  Patient presents with   Annual Exam    Subjective:   HPI: Sergio Pace is a 57 y.o. male presenting on 04/01/2022 for Annual Exam The patient presents for complete physical and review of chronic health problems. He/She also has the following acute concerns today: none  Elevated Cholesterol: Due for lab recheck Diet compliance:healthy foods Exercise:1-2 day a week. Other complaints:      Wt Readings from Last 3 Encounters:  04/01/22 153 lb 2 oz (69.5 kg)  05/02/21 154 lb 9 oz (70.1 kg)  08/30/20 153 lb (69.4 kg)     Relevant past medical, surgical, family and social history reviewed and updated as indicated. Interim medical history since our last visit reviewed. Allergies and medications reviewed and updated. No outpatient medications prior to visit.   No facility-administered medications prior to visit.     Per HPI unless specifically indicated in ROS section below Review of Systems  Constitutional:  Negative for fatigue and fever.  HENT:  Negative for ear pain.   Eyes:  Negative for pain.  Respiratory:  Negative for cough and shortness of breath.   Cardiovascular:  Negative for chest pain, palpitations and leg swelling.  Gastrointestinal:  Negative for abdominal pain.  Genitourinary:  Negative for dysuria.  Musculoskeletal:  Negative for arthralgias.  Neurological:  Negative for syncope, light-headedness and headaches.  Psychiatric/Behavioral:  Negative for dysphoric mood.    Objective:  BP 120/74   Pulse 61   Temp 98.1 F (36.7 C) (Temporal)   Ht 5' 8.75" (1.746 m)   Wt 153 lb 2 oz (69.5 kg)   SpO2 99%   BMI 22.78 kg/m   Wt Readings from Last 3 Encounters:  04/01/22  153 lb 2 oz (69.5 kg)  05/02/21 154 lb 9 oz (70.1 kg)  08/30/20 153 lb (69.4 kg)      Physical Exam Constitutional:      General: He is not in acute distress.    Appearance: Normal appearance. He is well-developed. He is not ill-appearing or toxic-appearing.  HENT:     Head: Normocephalic and atraumatic.     Right Ear: Hearing, tympanic membrane, ear canal and external ear normal.     Left Ear: Hearing, tympanic membrane, ear canal and external ear normal.     Nose: Nose normal. No septal deviation.     Right Turbinates: Not enlarged, swollen or pale.     Left Turbinates: Not enlarged, swollen or pale.     Mouth/Throat:     Pharynx: Uvula midline.  Eyes:     General: Lids are normal. Lids are everted, no foreign bodies appreciated.     Conjunctiva/sclera: Conjunctivae normal.     Pupils: Pupils are equal, round, and reactive to light.  Neck:     Thyroid: No thyroid mass or thyromegaly.     Vascular: No carotid bruit.     Trachea: Trachea and phonation normal.  Cardiovascular:     Rate and Rhythm: Normal rate and regular rhythm.     Pulses: Normal pulses.     Heart sounds: S1 normal and S2  normal. No murmur heard.    No gallop.  Pulmonary:     Breath sounds: Normal breath sounds. No wheezing, rhonchi or rales.  Abdominal:     General: Bowel sounds are normal.     Palpations: Abdomen is soft.     Tenderness: There is no abdominal tenderness. There is no guarding or rebound.     Hernia: No hernia is present.  Musculoskeletal:     Cervical back: Normal range of motion and neck supple.  Lymphadenopathy:     Cervical: No cervical adenopathy.  Skin:    General: Skin is warm and dry.     Findings: No rash.  Neurological:     Mental Status: He is alert.     Cranial Nerves: No cranial nerve deficit.     Sensory: No sensory deficit.     Gait: Gait normal.     Deep Tendon Reflexes: Reflexes are normal and symmetric.  Psychiatric:        Speech: Speech normal.        Behavior:  Behavior normal.        Judgment: Judgment normal.       Results for orders placed or performed in visit on 02/13/20  PSA  Result Value Ref Range   PSA 2.13 0.10 - 4.00 ng/mL  Comprehensive metabolic panel  Result Value Ref Range   Sodium 139 135 - 145 mEq/L   Potassium 3.5 3.5 - 5.1 mEq/L   Chloride 103 96 - 112 mEq/L   CO2 31 19 - 32 mEq/L   Glucose, Bld 75 70 - 99 mg/dL   BUN 11 6 - 23 mg/dL   Creatinine, Ser 0.77 0.40 - 1.50 mg/dL   Total Bilirubin 0.6 0.2 - 1.2 mg/dL   Alkaline Phosphatase 54 39 - 117 U/L   AST 21 0 - 37 U/L   ALT 17 0 - 53 U/L   Total Protein 7.6 6.0 - 8.3 g/dL   Albumin 4.6 3.5 - 5.2 g/dL   GFR 102.12 >60.00 mL/min   Calcium 10.2 8.4 - 10.5 mg/dL  Lipid panel  Result Value Ref Range   Cholesterol 208 (H) 0 - 200 mg/dL   Triglycerides 106.0 0.0 - 149.0 mg/dL   HDL 50.20 >39.00 mg/dL   VLDL 21.2 0.0 - 40.0 mg/dL   LDL Cholesterol 136 (H) 0 - 99 mg/dL   Total CHOL/HDL Ratio 4    NonHDL 157.37     This visit occurred during the SARS-CoV-2 public health emergency.  Safety protocols were in place, including screening questions prior to the visit, additional usage of staff PPE, and extensive cleaning of exam room while observing appropriate contact time as indicated for disinfecting solutions.   COVID 19 screen:  No recent travel or known exposure to COVID19 The patient denies respiratory symptoms of COVID 19 at this time. The importance of social distancing was discussed today.   Assessment and Plan The patient's preventative maintenance and recommended screening tests for an annual wellness exam were reviewed in full today. Brought up to date unless services declined.  Counselled on the importance of diet, exercise, and its role in overall health and mortality. The patient's FH and SH was reviewed, including their home life, tobacco status, and drug and alcohol status.    Vaccines:covid X 3, due for tdap, shingrix Prostate Cancer Screen:  Lab  Results  Component Value Date   PSA 2.13 02/13/2020   PSA 1.59 12/05/2018  Colon Cancer Screen: 2021 cologuard negative, repeat in  2024      Smoking Status:none ETOH/ drug use  occ/none  Hep C:  due  HIV screen:   none  Problem List Items Addressed This Visit   None    Eliezer Lofts, MD

## 2022-04-01 NOTE — Addendum Note (Signed)
Addended by: Ellamae Sia on: 04/01/2022 11:50 AM   Modules accepted: Orders

## 2022-04-02 LAB — HEPATITIS C ANTIBODY: Hepatitis C Ab: NONREACTIVE

## 2022-04-02 LAB — HIV ANTIBODY (ROUTINE TESTING W REFLEX): HIV 1&2 Ab, 4th Generation: NONREACTIVE

## 2022-04-15 LAB — COLOGUARD: COLOGUARD: NEGATIVE

## 2022-05-27 ENCOUNTER — Encounter: Payer: Self-pay | Admitting: Family Medicine

## 2023-10-28 ENCOUNTER — Encounter: Payer: Self-pay | Admitting: Family Medicine

## 2023-10-29 ENCOUNTER — Encounter: Payer: Self-pay | Admitting: Family Medicine

## 2023-11-04 ENCOUNTER — Ambulatory Visit: Payer: Self-pay | Admitting: Family Medicine

## 2023-11-04 ENCOUNTER — Encounter: Payer: Self-pay | Admitting: Family Medicine

## 2023-11-04 VITALS — BP 130/82 | HR 70 | Temp 97.8°F | Ht 68.25 in | Wt 159.1 lb

## 2023-11-04 DIAGNOSIS — Z0000 Encounter for general adult medical examination without abnormal findings: Secondary | ICD-10-CM

## 2023-11-04 DIAGNOSIS — Z125 Encounter for screening for malignant neoplasm of prostate: Secondary | ICD-10-CM

## 2023-11-04 DIAGNOSIS — E785 Hyperlipidemia, unspecified: Secondary | ICD-10-CM | POA: Diagnosis not present

## 2023-11-04 DIAGNOSIS — M79604 Pain in right leg: Secondary | ICD-10-CM | POA: Diagnosis not present

## 2023-11-04 NOTE — Progress Notes (Signed)
 Patient ID: Sergio Pace, male    DOB: 1965/06/22, 58 y.o.   MRN: [id number]  This visit was conducted in person.  BP 130/82   Pulse 70   Temp 97.8 F (36.6 C) (Oral)   Ht 5' 8.25 (1.734 m)   Wt 159 lb 2 oz (72.2 kg)   SpO2 96%   BMI 24.02 kg/m    CC: Chief Complaint  Patient presents with   Annual Exam    Subjective:   HPI: Sergio Pace is a 58 y.o. male presenting on 11/04/2023 for Annual Exam The patient presents for complete physical and review of chronic health problems. He/She also has the following acute concerns today: none   He is moving to [facility]. In next week.  Elevated Cholesterol: Due for lab recheck Diet compliance:healthy foods Exercise:1-2 day a week. Other complaints:      Wt Readings from Last 3 Encounters:  11/04/23 159 lb 2 oz (72.2 kg)  04/01/22 153 lb 2 oz (69.5 kg)  05/02/21 154 lb 9 oz (70.1 kg)     Relevant past medical, surgical, family and social history reviewed and updated as indicated. Interim medical history since our last visit reviewed. Allergies and medications reviewed and updated. No outpatient medications prior to visit.   No facility-administered medications prior to visit.     Per HPI unless specifically indicated in ROS section below Review of Systems  Constitutional:  Negative for fatigue and fever.  HENT:  Negative for ear pain.   Eyes:  Negative for pain.  Respiratory:  Negative for cough and shortness of breath.   Cardiovascular:  Negative for chest pain, palpitations and leg swelling.  Gastrointestinal:  Negative for abdominal pain.  Genitourinary:  Negative for dysuria.  Musculoskeletal:  Negative for arthralgias.  Neurological:  Negative for syncope, light-headedness and headaches.  Psychiatric/Behavioral:  Negative for dysphoric mood.    Objective:  BP 130/82   Pulse 70   Temp 97.8 F (36.6 C) (Oral)   Ht 5' 8.25 (1.734 m)   Wt 159 lb 2 oz (72.2 kg)   SpO2 96%   BMI 24.02 kg/m   Wt  Readings from Last 3 Encounters:  11/04/23 159 lb 2 oz (72.2 kg)  04/01/22 153 lb 2 oz (69.5 kg)  05/02/21 154 lb 9 oz (70.1 kg)      Physical Exam Constitutional:      General: He is not in acute distress.    Appearance: Normal appearance. He is well-developed. He is not ill-appearing or toxic-appearing.  HENT:     Head: Normocephalic and atraumatic.     Right Ear: Hearing, tympanic membrane, ear canal and external ear normal.     Left Ear: Hearing, tympanic membrane, ear canal and external ear normal.     Nose: Nose normal. No septal deviation.     Right Turbinates: Not enlarged, swollen or pale.     Left Turbinates: Not enlarged, swollen or pale.     Mouth/Throat:     Pharynx: Uvula midline.  Eyes:     General: Lids are normal. Lids are everted, no foreign bodies appreciated.     Conjunctiva/sclera: Conjunctivae normal.     Pupils: Pupils are equal, round, and reactive to light.  Neck:     Thyroid: No thyroid mass or thyromegaly.     Vascular: No carotid bruit.     Trachea: Trachea and phonation normal.  Cardiovascular:     Rate and Rhythm: Normal rate and regular rhythm.  Pulses: Normal pulses.     Heart sounds: S1 normal and S2 normal. No murmur heard.    No gallop.  Pulmonary:     Breath sounds: Normal breath sounds. No wheezing, rhonchi or rales.  Abdominal:     General: Bowel sounds are normal.     Palpations: Abdomen is soft.     Tenderness: There is no abdominal tenderness. There is no guarding or rebound.     Hernia: No hernia is present.  Musculoskeletal:     Cervical back: Normal range of motion and neck supple.  Lymphadenopathy:     Cervical: No cervical adenopathy.  Skin:    General: Skin is warm and dry.     Findings: No rash.  Neurological:     Mental Status: He is alert.     Cranial Nerves: No cranial nerve deficit.     Sensory: No sensory deficit.     Gait: Gait normal.     Deep Tendon Reflexes: Reflexes are normal and symmetric.  Psychiatric:         Speech: Speech normal.        Behavior: Behavior normal.        Judgment: Judgment normal.       Results for orders placed or performed in visit on 04/01/22  HIV Antibody (routine testing w rflx)   Collection Time: 04/01/22 10:56 AM  Result Value Ref Range   HIV 1&2 Ab, 4th Generation NON-REACTIVE NON-REACTIVE  Hepatitis C antibody   Collection Time: 04/01/22 10:56 AM  Result Value Ref Range   Hepatitis C Ab NON-REACTIVE NON-REACTIVE  Lipid panel   Collection Time: 04/01/22 10:56 AM  Result Value Ref Range   Cholesterol 206 (H) 0 - 200 mg/dL   Triglycerides 04.9 0.0 - 149.0 mg/dL   HDL 49.09 >60.99 mg/dL   VLDL 80.9 0.0 - 59.9 mg/dL   LDL Cholesterol 863 (H) 0 - 99 mg/dL   Total CHOL/HDL Ratio 4    NonHDL 154.90   Comprehensive metabolic panel   Collection Time: 04/01/22 10:56 AM  Result Value Ref Range   Sodium 138 135 - 145 mEq/L   Potassium 4.1 3.5 - 5.1 mEq/L   Chloride 100 96 - 112 mEq/L   CO2 30 19 - 32 mEq/L   Glucose, Bld 80 70 - 99 mg/dL   BUN 9 6 - 23 mg/dL   Creatinine, Ser 9.22 0.40 - 1.50 mg/dL   Total Bilirubin 0.4 0.2 - 1.2 mg/dL   Alkaline Phosphatase 54 39 - 117 U/L   AST 18 0 - 37 U/L   ALT 15 0 - 53 U/L   Total Protein 7.1 6.0 - 8.3 g/dL   Albumin 4.4 3.5 - 5.2 g/dL   GFR 899.39 >39.99 mL/min   Calcium 10.1 8.4 - 10.5 mg/dL  PSA   Collection Time: 04/01/22 10:56 AM  Result Value Ref Range   PSA 2.18 0.10 - 4.00 ng/mL  Cologuard   Collection Time: 04/08/22 11:20 AM  Result Value Ref Range   COLOGUARD Negative Negative    This visit occurred during the SARS-CoV-2 public health emergency.  Safety protocols were in place, including screening questions prior to the visit, additional usage of staff PPE, and extensive cleaning of exam room while observing appropriate contact time as indicated for disinfecting solutions.   COVID 19 screen:  No recent travel or known exposure to COVID19 The patient denies respiratory symptoms of COVID 19 at  this time. The importance of social distancing was discussed  today.   Assessment and Plan The patient's preventative maintenance and recommended screening tests for an annual wellness exam were reviewed in full today. Brought up to date unless services declined.  Counselled on the importance of diet, exercise, and its role in overall health and mortality. The patient's FH and SH was reviewed, including their home life, tobacco status, and drug and alcohol status.    Vaccines:covid X 3, due for flu, tdap, shingrix, prevnar. Prostate Cancer Screen:  Due Lab Results  Component Value Date   PSA 2.18 04/01/2022   PSA 2.13 02/13/2020   PSA 1.59 12/05/2018  Colon Cancer Screen: 2021 cologuard negative, repeat in 2024 negative, repeat in 3 years.      Smoking Status:none ETOH/ drug use  occ/none  Hep C:  negative  HIV screen:   none  Problem List Items Addressed This Visit     Hyperlipidemia (Chronic)   Relevant Orders   Lipid panel   Comprehensive metabolic panel with GFR   Right leg pain    Chronic intermittent, nml exam.  No clear cramping, swelling, nml mobility  ? Nerve compression       Other Visit Diagnoses       Routine general medical examination at a health care facility    -  Primary     Prostate cancer screening       Relevant Orders   PSA        Victoria Henshaw, MD

## 2023-11-04 NOTE — Assessment & Plan Note (Signed)
 Chronic intermittent, nml exam.  No clear cramping, swelling, nml mobility  ? Nerve compression

## 2023-11-05 LAB — LIPID PANEL
Cholesterol: 224 mg/dL — ABNORMAL HIGH (ref 0–200)
HDL: 43.5 mg/dL (ref >39.00)
LDL Cholesterol: 160 mg/dL — ABNORMAL HIGH (ref 0–99)
NonHDL: 180.84
Total CHOL/HDL Ratio: 5
Triglycerides: 105 mg/dL (ref 0.0–149.0)
VLDL: 21 mg/dL (ref 0.0–40.0)

## 2023-11-05 LAB — COMPREHENSIVE METABOLIC PANEL WITH GFR
ALT: 18 U/L (ref 0–53)
AST: 20 U/L (ref 0–37)
Albumin: 4.3 g/dL (ref 3.5–5.2)
Alkaline Phosphatase: 46 U/L (ref 39–117)
BUN: 12 mg/dL (ref 6–23)
CO2: 26 meq/L (ref 19–32)
Calcium: 9.2 mg/dL (ref 8.4–10.5)
Chloride: 102 meq/L (ref 96–112)
Creatinine, Ser: 0.75 mg/dL (ref 0.40–1.50)
GFR: 100.27 mL/min (ref >60.00)
Glucose, Bld: 77 mg/dL (ref 70–99)
Potassium: 3.8 meq/L (ref 3.5–5.1)
Sodium: 137 meq/L (ref 135–145)
Total Bilirubin: 0.4 mg/dL (ref 0.2–1.2)
Total Protein: 6.8 g/dL (ref 6.0–8.3)

## 2023-11-05 LAB — PSA: PSA: 2.35 ng/mL (ref 0.10–4.00)

## 2023-11-09 ENCOUNTER — Ambulatory Visit: Payer: Self-pay | Admitting: Family Medicine

## 2023-11-16 ENCOUNTER — Other Ambulatory Visit: Payer: Self-pay | Admitting: Family Medicine

## 2023-11-16 MED ORDER — ATORVASTATIN CALCIUM 10 MG PO TABS: 10.0000 mg | ORAL_TABLET | Freq: Every day | ORAL | 0 refills | Status: AC
